# Patient Record
Sex: Female | Born: 2000 | Race: White | Hispanic: No | Marital: Single | Smoking: Never smoker
Health system: Southern US, Community
[De-identification: ages and names within clinical notes are randomized; demographics above are authoritative.]

## PROBLEM LIST (undated history)

## (undated) HISTORY — PX: TONSILLECTOMY AND ADENOIDECTOMY: SUR1326

---
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
## 2021-12-20 IMAGING — US US ABDOMEN LIMITED
1 series · 14 of 25 positions shown · non-contrast
Comparison: None.

CLINICAL DATA: Abdominal pain and nausea.

EXAM:
ULTRASOUND ABDOMEN LIMITED RIGHT UPPER QUADRANT

[Series 1: us abdomen limited · 14 of 50 slices shown]
[im 1/50]
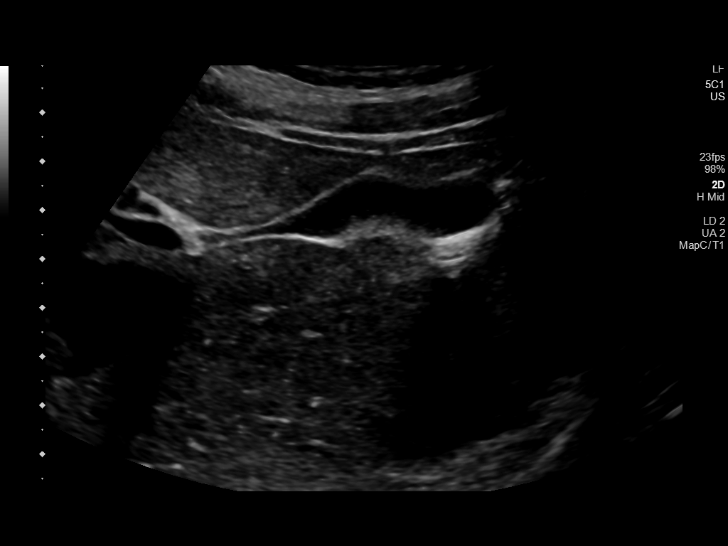
[im 5/50]
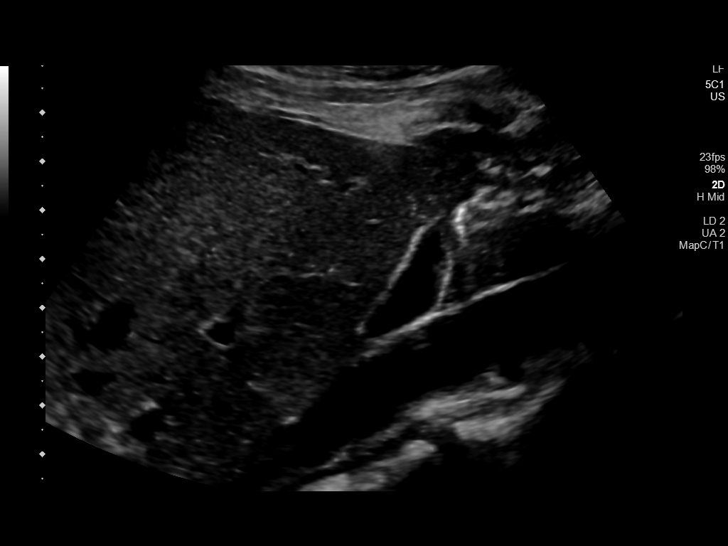
[im 9/50]
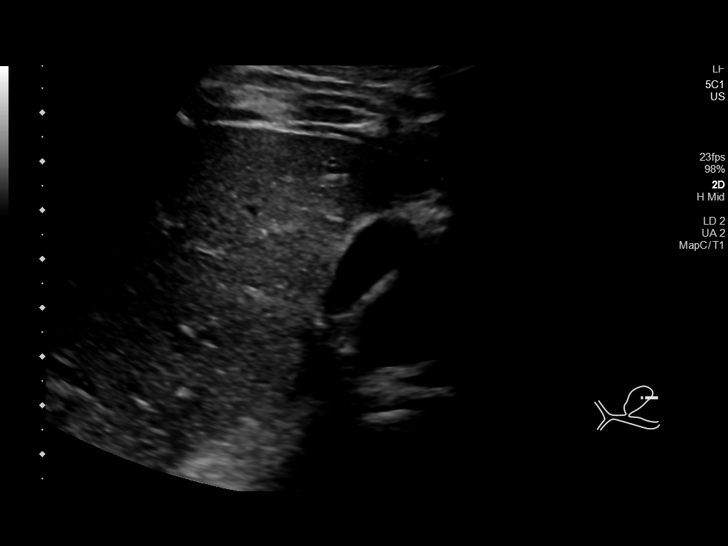
[im 13/50]
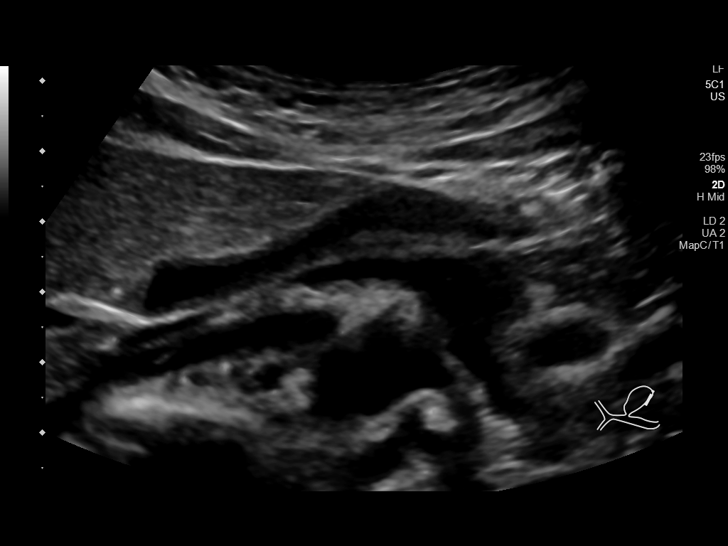
[im 17/50]
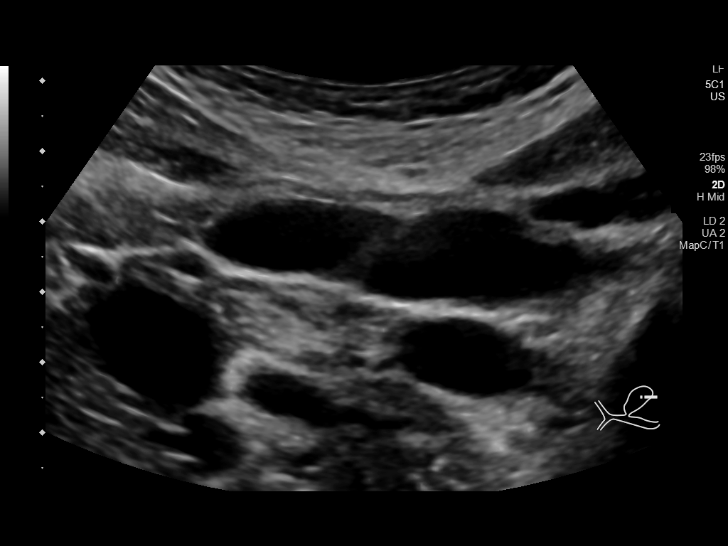
[im 19/50]
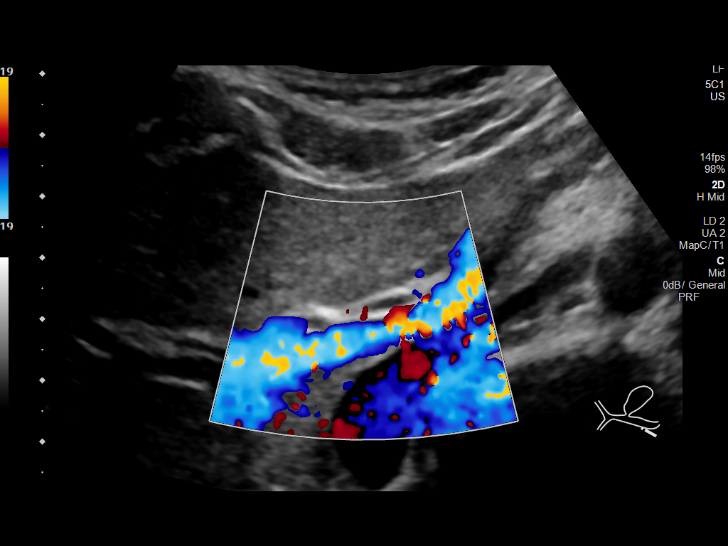
[im 23/50]
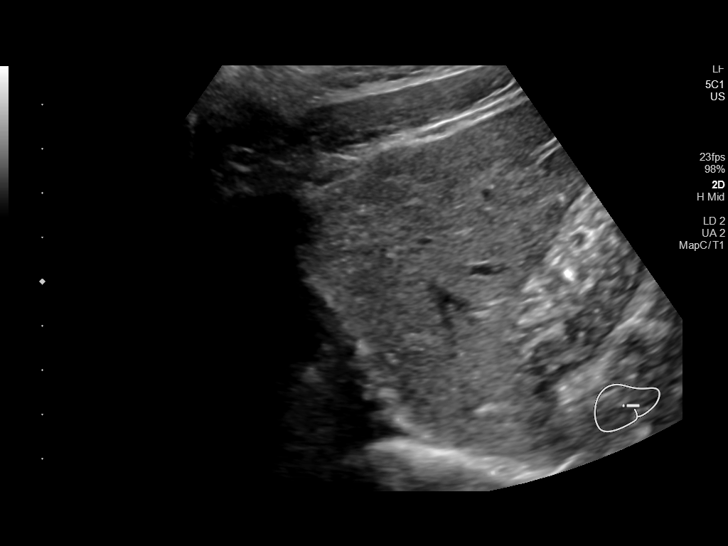
[im 27/50]
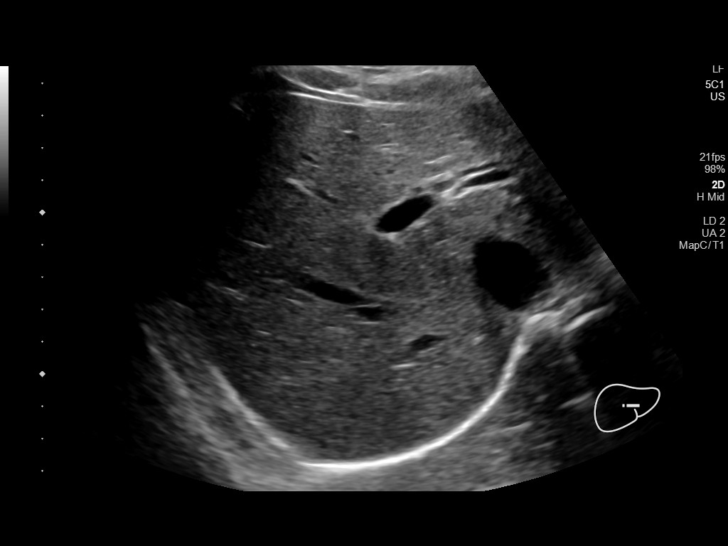
[im 31/50]
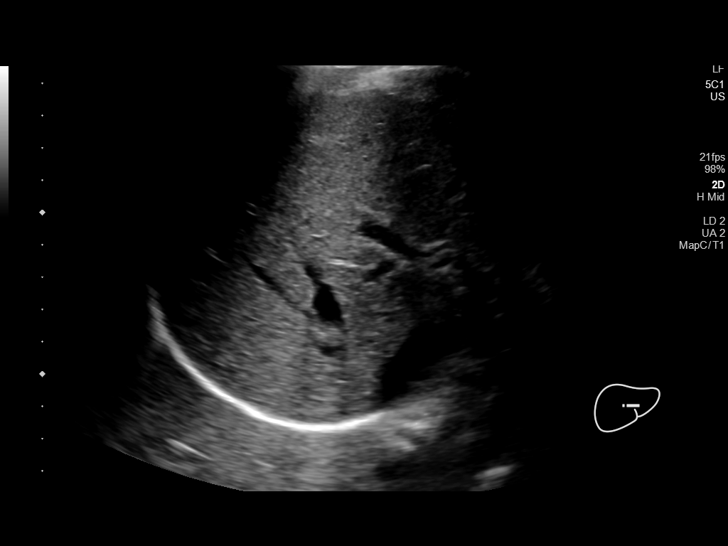
[im 33/50]
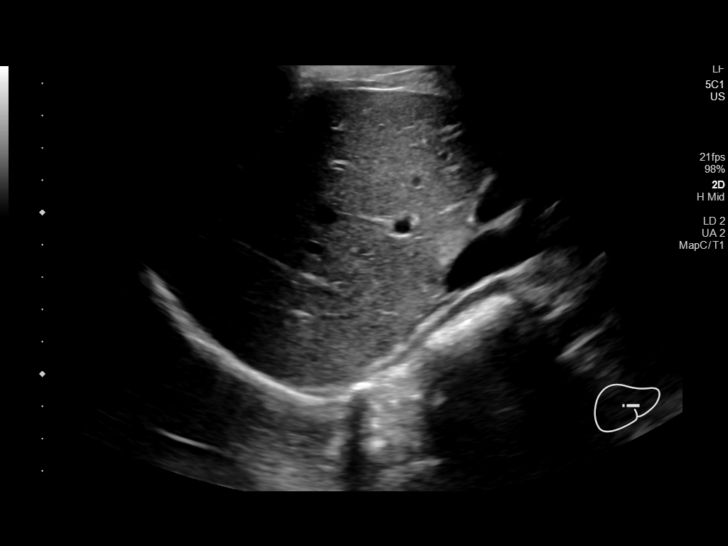
[im 37/50]
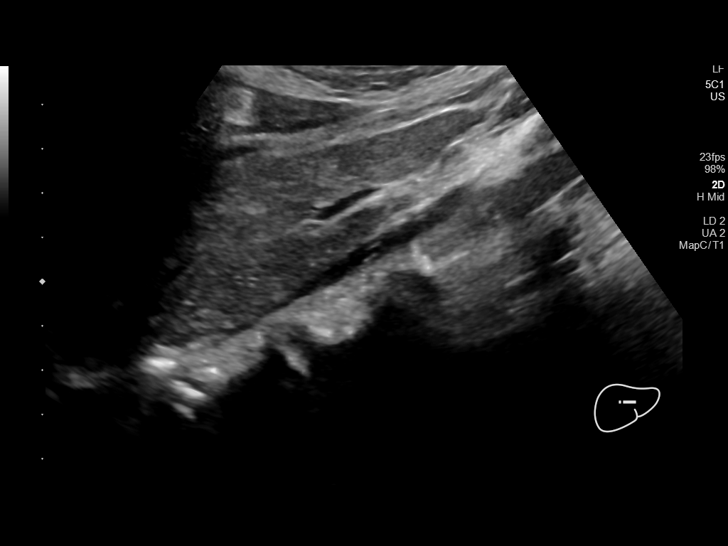
[im 41/50]
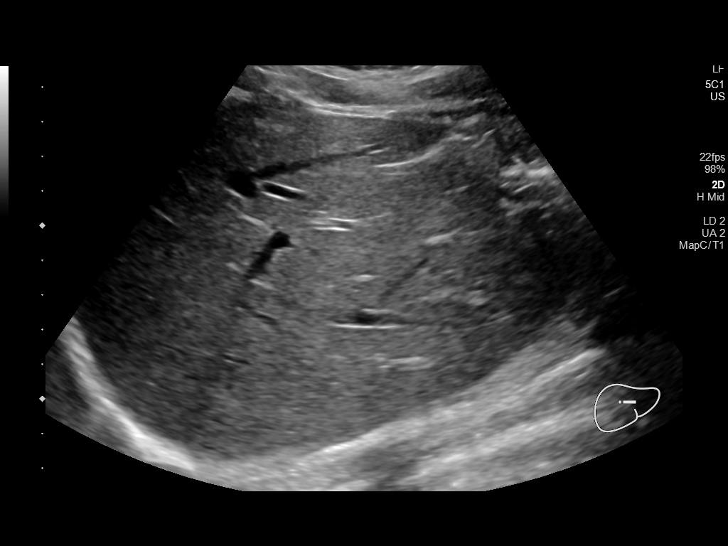
[im 45/50]
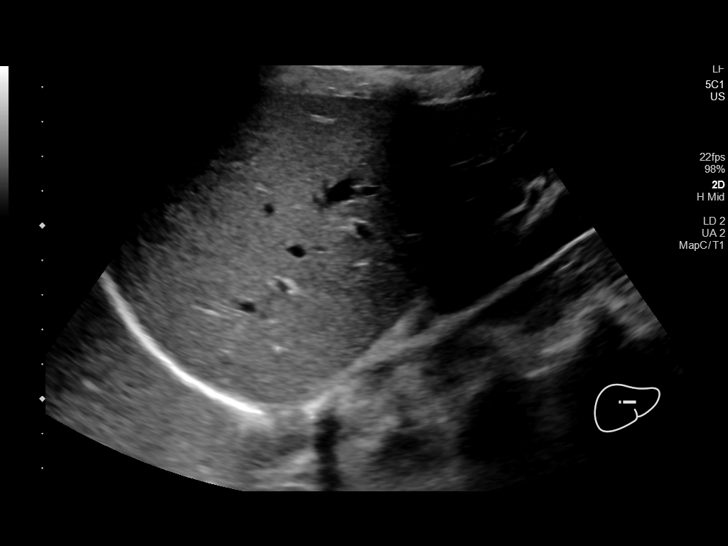
[im 50/50]
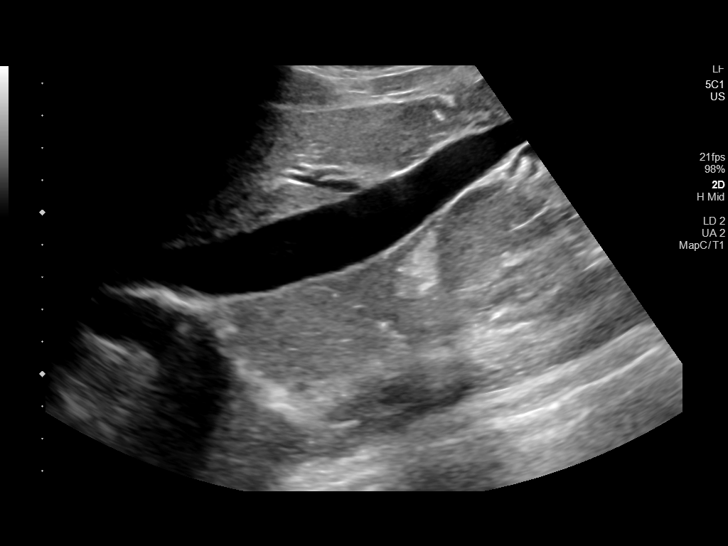

[14 of 25 positions shown; findings below may reference images not displayed]

FINDINGS: Gallbladder:

No gallstones or wall thickening visualized. No sonographic Murphy
sign noted by sonographer.

Common bile duct:

Diameter: 2 mm, within normal limits.

Liver:

No focal lesion identified. Within normal limits in parenchymal
echogenicity. Portal vein is patent on color Doppler imaging with
normal direction of blood flow towards the liver.

Other: None.
IMPRESSION: Negative. No hepatobiliary abnormality identified.
